# Patient Record
Sex: Male | Born: 1970 | Race: Black or African American | Hispanic: No | Marital: Married | Smoking: Current every day smoker
Health system: Southern US, Community
[De-identification: ages and names within clinical notes are randomized; demographics above are authoritative.]

---

## 2016-02-08 ENCOUNTER — Emergency Department (HOSPITAL_BASED_OUTPATIENT_CLINIC_OR_DEPARTMENT_OTHER)
Admission: EM | Admit: 2016-02-08 | Discharge: 2016-02-08 | Disposition: A | Discharge status: Home / Self Care | Payer: Self-pay | Attending: Emergency Medicine | Admitting: Emergency Medicine

## 2016-02-08 ENCOUNTER — Encounter (HOSPITAL_BASED_OUTPATIENT_CLINIC_OR_DEPARTMENT_OTHER): Payer: Self-pay | Admitting: Emergency Medicine

## 2016-02-08 DIAGNOSIS — R197 Diarrhea, unspecified: Secondary | ICD-10-CM | POA: Insufficient documentation

## 2016-02-08 DIAGNOSIS — R112 Nausea with vomiting, unspecified: Secondary | ICD-10-CM | POA: Insufficient documentation

## 2016-02-08 DIAGNOSIS — F17200 Nicotine dependence, unspecified, uncomplicated: Secondary | ICD-10-CM | POA: Insufficient documentation

## 2016-02-08 MED ORDER — PROMETHAZINE HCL 25 MG PO TABS
12.5000 mg | ORAL_TABLET | Freq: Once | ORAL | Status: AC
  Administered 2016-02-08: 12.5 mg via ORAL
  Filled 2016-02-08: qty 1

## 2016-02-08 MED ORDER — PROMETHAZINE HCL 25 MG PO TABS: 25.0000 mg | ORAL_TABLET | Freq: Four times a day (QID) | ORAL | Status: DC | PRN

## 2016-02-08 NOTE — ED Notes (Signed)
Onset yesterday approx 30 min after eating chicken with abd cramping, nausea and several episodes of diarrhea. Pt feels dehydrated.

## 2016-02-08 NOTE — ED Provider Notes (Signed)
CSN: [id number]     Arrival date & time 02/08/16  1222 History   First MD Initiated Contact with Patient 02/08/16 1305     Chief Complaint  Patient presents with  . Abdominal Pain     (Consider location/radiation/quality/duration/timing/severity/associated sxs/prior Treatment) HPI   45 year old male presenting with complaint of abdominal pain.Patient report last night while at work he ate a hamburger and some chicken strips and shortly afterward begin to develop diffuse abdominal cramping. He felt nauseous and vomited once of nonbloody nonbilious vomit and since then has multiple bouts of nonbloody non-mucousy diarrhea. His symptoms has since improved. He report drinking ginger ale did help with his nausea but he still feels nauseous and still just dehydrated. He otherwise denies having any fever, URI symptoms, chest pain, shortness of breath, productive cough, active abdominal pain or back pain, dysuria, or rash. No other recent sick contact. Patient felt this is likely to be a 24 hours bugs. No prior history of abdominal surgery and no other significant medical history. Patient is a social drinker, had a history of marijuana use but has recently quit using marijuana. He is a smoker.  History reviewed. No pertinent past medical history. History reviewed. No pertinent past surgical history. History reviewed. No pertinent family history. Social History  Substance Use Topics  . Smoking status: Current Every Day Smoker  . Smokeless tobacco: None  . Alcohol Use: Yes     Comment: socially    Review of Systems  All other systems reviewed and are negative.     Allergies  Review of patient's allergies indicates no known allergies.  Home Medications   Prior to Admission medications   Not on File   BP 120/84 mmHg  Pulse 83  Temp(Src) 98.4 F (36.9 C) (Oral)  Resp 20  SpO2 99% Physical Exam  Constitutional: He appears well-developed and well-nourished. No distress.  HENT:  Head:  Atraumatic.  Right Ear: External ear normal.  Left Ear: External ear normal.  Mouth/Throat: Oropharynx is clear and moist.  Eyes: Conjunctivae are normal.  Neck: Neck supple.  Cardiovascular: Normal rate and regular rhythm.   Pulmonary/Chest: Effort normal and breath sounds normal.  Abdominal: Soft. Bowel sounds are normal. He exhibits no distension. There is no tenderness.  Genitourinary:  No CVA tenderness  Neurological: He is alert.  Skin: No rash noted.  Psychiatric: He has a normal mood and affect.  Nursing note and vitals reviewed.   ED Course  Procedures (including critical care time)   MDM   Final diagnoses:  Nausea vomiting and diarrhea    BP 120/84 mmHg  Pulse 83  Temp(Src) 98.4 F (36.9 C) (Oral)  Resp 20  SpO2 99%   2:02 PM Patient presents with nausea vomiting diarrhea. Minimal abdominal pain. No longer having diarrhea. Mild nausea only. Able to tolerates by mouth. Suspect viral gastroenteritis. Low suspicion for acute abdominal pathology such as appendicitis or colitis. Patient agrees to treat symptoms. Return precaution discussed.  Fayrene HelperBowie Edmundo Tedesco, PA-C 02/08/16 1403  Lavera Guiseana Duo Liu, MD 02/08/16 [phone]

## 2016-02-08 NOTE — Discharge Instructions (Signed)

## 2018-05-13 ENCOUNTER — Emergency Department (HOSPITAL_BASED_OUTPATIENT_CLINIC_OR_DEPARTMENT_OTHER): Payer: Self-pay

## 2018-05-13 ENCOUNTER — Emergency Department (HOSPITAL_BASED_OUTPATIENT_CLINIC_OR_DEPARTMENT_OTHER)
Admission: EM | Admit: 2018-05-13 | Discharge: 2018-05-13 | Disposition: A | Discharge status: Home / Self Care | Payer: Self-pay | Attending: Emergency Medicine | Admitting: Emergency Medicine

## 2018-05-13 ENCOUNTER — Encounter (HOSPITAL_BASED_OUTPATIENT_CLINIC_OR_DEPARTMENT_OTHER): Payer: Self-pay

## 2018-05-13 ENCOUNTER — Other Ambulatory Visit: Payer: Self-pay

## 2018-05-13 DIAGNOSIS — F17210 Nicotine dependence, cigarettes, uncomplicated: Secondary | ICD-10-CM | POA: Insufficient documentation

## 2018-05-13 DIAGNOSIS — Y929 Unspecified place or not applicable: Secondary | ICD-10-CM | POA: Insufficient documentation

## 2018-05-13 DIAGNOSIS — S39012A Strain of muscle, fascia and tendon of lower back, initial encounter: Secondary | ICD-10-CM | POA: Insufficient documentation

## 2018-05-13 DIAGNOSIS — Y998 Other external cause status: Secondary | ICD-10-CM | POA: Insufficient documentation

## 2018-05-13 DIAGNOSIS — Y9389 Activity, other specified: Secondary | ICD-10-CM | POA: Insufficient documentation

## 2018-05-13 DIAGNOSIS — W208XXA Other cause of strike by thrown, projected or falling object, initial encounter: Secondary | ICD-10-CM | POA: Insufficient documentation

## 2018-05-13 MED ORDER — IBUPROFEN 400 MG PO TABS
600.0000 mg | ORAL_TABLET | Freq: Once | ORAL | Status: AC
  Administered 2018-05-13: 600 mg via ORAL
  Filled 2018-05-13: qty 1

## 2018-05-13 NOTE — ED Notes (Signed)
Pt/family verbalized understanding of discharge instructions.   

## 2018-05-13 NOTE — Discharge Instructions (Signed)
Develop worsening or uncontrolled pain, weakness or numbness in your legs, incontinence of your bowels or bladder, blood when you urinate or any other new/concerning symptoms and return to the ER for evaluation.  Otherwise you may use Tylenol and/or ibuprofen for pain.  You may use ice.

## 2018-05-13 NOTE — ED Triage Notes (Signed)
Pt states tires fell on his back at work approx 130am-pain to lower back-NAD-steady gait

## 2018-05-13 NOTE — ED Provider Notes (Signed)
[facility] EMERGENCY DEPARTMENT Provider Note   CSN: [id number] Arrival date & time: 05/13/18  1146     History   Chief Complaint Chief Complaint  Patient presents with  . Back Pain    HPI Alexander Clark is a 47 y.o. male.  HPI  47 year old male presents after back injury.  He was unloading a truck and multiple tires landed on his back.  Pushed him towards the ground but he did not fall or get crushed.  Pain is over his left posterior pelvis.  No midline back pain.  He has taken Goody powders with partial relief.  No weakness or numbness in the extremities.  No chest, abdominal pain.  No dyspnea.  He has urinated since this happened but has not seen any hematuria.  History reviewed. No pertinent past medical history.  There are no active problems to display for this patient.   History reviewed. No pertinent surgical history.      Home Medications    Prior to Admission medications   Medication Sig Start Date End Date Taking? Authorizing Provider  promethazine (PHENERGAN) 25 MG tablet Take 1 tablet (25 mg total) by mouth every 6 (six) hours as needed for nausea. 02/08/16   Fayrene Helperran, Bowie, PA-C    Family History No family history on file.  Social History Social History   Tobacco Use  . Smoking status: Current Every Day Smoker    Types: Cigarettes  . Smokeless tobacco: Never Used  Substance Use Topics  . Alcohol use: Yes    Comment: occ  . Drug use: Not Currently    Types: Marijuana     Allergies   Patient has no known allergies.   Review of Systems Review of Systems  Respiratory: Negative for shortness of breath.   Cardiovascular: Negative for chest pain.  Gastrointestinal: Negative for abdominal pain.  Genitourinary: Negative for hematuria.  Musculoskeletal: Positive for back pain.  Neurological: Negative for weakness and numbness.     Physical Exam Updated Vital Signs BP 121/81 (BP Location: Left Arm)   Pulse 74   Temp 98 F (36.7 C)  (Oral)   Resp 14   Ht 5\' 5"  (1.651 m)   Wt 64.4 kg   SpO2 100%   BMI 23.63 kg/m   Physical Exam  Constitutional: He is oriented to person, place, and time. He appears well-developed and well-nourished. No distress.  HENT:  Head: Normocephalic and atraumatic.  Right Ear: External ear normal.  Left Ear: External ear normal.  Nose: Nose normal.  Eyes: Right eye exhibits no discharge. Left eye exhibits no discharge.  Neck: Neck supple.  Pulmonary/Chest: Effort normal. He exhibits no tenderness.  Abdominal: Soft. He exhibits no distension. There is no tenderness.  Musculoskeletal: He exhibits no edema.       Right hip: He exhibits normal range of motion and no tenderness.       Left hip: He exhibits normal range of motion and no tenderness.       Thoracic back: He exhibits no tenderness and no bony tenderness.       Lumbar back: He exhibits no tenderness and no bony tenderness.       Back:  Neurological: He is alert and oriented to person, place, and time.  5/5 strength in BLE. Normal gross sensation  Skin: Skin is warm and dry. He is not diaphoretic.  Nursing note and vitals reviewed.    ED Treatments / Results  Labs (all labs ordered are listed, but only  abnormal results are displayed) Labs Reviewed - No data to display  EKG None  Radiology Dg Pelvis 1-2 Views  Result Date: 05/13/2018 CLINICAL DATA:  Left hip pain after tire fell on posterior pelvis last night. EXAM: PELVIS - 1-2 VIEW COMPARISON:  None. FINDINGS: There is no evidence of pelvic fracture or diastasis. No pelvic bone lesions are seen. IMPRESSION: Negative. Electronically Signed   By: Elberta Fortis M.D.   On: 05/13/2018 13:46    Procedures Procedures (including critical care time)  Medications Ordered in ED Medications  ibuprofen (ADVIL,MOTRIN) tablet 600 mg (600 mg Oral Given 05/13/18 1342)     Initial Impression / Assessment and Plan / ED Course  I have reviewed the triage vital signs and the  nursing notes.  Pertinent labs & imaging results that were available during my care of the patient were reviewed by me and considered in my medical decision making (see chart for details).     Presentation is consistent with muscular pain after direct trauma.  No CVA tenderness, gross hematuria, ecchymosis to the flank to suggest intra-abdominal/retroperitoneal injury.  No abdominal pain or rib pain.  No midline back pain to suggest lumbar fracture or injury.  Point tenderness that is mild over the left posterior pelvis and so x-ray is obtained and negative.  Ambulate without difficulty and benign neuro exam.  Discharged home.  Final Clinical Impressions(s) / ED Diagnoses   Final diagnoses:  Strain of lumbar region, initial encounter    ED Discharge Orders    None       Pricilla Loveless, MD 05/13/18 1453

## 2018-10-04 ENCOUNTER — Encounter (HOSPITAL_BASED_OUTPATIENT_CLINIC_OR_DEPARTMENT_OTHER): Payer: Self-pay | Admitting: *Deleted

## 2018-10-04 ENCOUNTER — Emergency Department (HOSPITAL_BASED_OUTPATIENT_CLINIC_OR_DEPARTMENT_OTHER): Payer: 59

## 2018-10-04 ENCOUNTER — Emergency Department (HOSPITAL_BASED_OUTPATIENT_CLINIC_OR_DEPARTMENT_OTHER)
Admission: EM | Admit: 2018-10-04 | Discharge: 2018-10-04 | Disposition: A | Discharge status: Home / Self Care | Payer: 59 | Attending: Emergency Medicine | Admitting: Emergency Medicine

## 2018-10-04 ENCOUNTER — Other Ambulatory Visit: Payer: Self-pay

## 2018-10-04 DIAGNOSIS — F17210 Nicotine dependence, cigarettes, uncomplicated: Secondary | ICD-10-CM | POA: Insufficient documentation

## 2018-10-04 DIAGNOSIS — Y9289 Other specified places as the place of occurrence of the external cause: Secondary | ICD-10-CM | POA: Insufficient documentation

## 2018-10-04 DIAGNOSIS — S39012A Strain of muscle, fascia and tendon of lower back, initial encounter: Secondary | ICD-10-CM | POA: Insufficient documentation

## 2018-10-04 DIAGNOSIS — Y9389 Activity, other specified: Secondary | ICD-10-CM | POA: Diagnosis not present

## 2018-10-04 DIAGNOSIS — X509XXA Other and unspecified overexertion or strenuous movements or postures, initial encounter: Secondary | ICD-10-CM | POA: Insufficient documentation

## 2018-10-04 DIAGNOSIS — S3992XA Unspecified injury of lower back, initial encounter: Secondary | ICD-10-CM | POA: Diagnosis present

## 2018-10-04 DIAGNOSIS — Y998 Other external cause status: Secondary | ICD-10-CM | POA: Diagnosis not present

## 2018-10-04 MED ORDER — NAPROXEN 500 MG PO TABS: 500.0000 mg | ORAL_TABLET | Freq: Two times a day (BID) | ORAL | 0 refills | Status: DC

## 2018-10-04 MED ORDER — METHOCARBAMOL 500 MG PO TABS: 500.0000 mg | ORAL_TABLET | Freq: Every evening | ORAL | 0 refills | Status: DC | PRN

## 2018-10-04 NOTE — ED Notes (Signed)
Patient transported to X-ray 

## 2018-10-04 NOTE — Discharge Instructions (Addendum)
Take naproxen 2 times a day with meals.  Do not take other anti-inflammatories at the same time (Advil, Motrin, ibuprofen, Aleve). You may supplement with Tylenol if you need further pain control. Use Robaxin as needed for muscle stiffness or soreness. Have caution, as this may make you tired or groggy. Do not drive or operate heavy machinery while taking this medication.  Use muscle creams (bengay, icy hot, salonpas) as needed for pain.  Call [facility] to establish care.  Return to the ER if you develop high fevers, numbness, loss of bowel or bladder control, or any new or concerning symptoms.

## 2018-10-04 NOTE — ED Provider Notes (Signed)
[facility] EMERGENCY DEPARTMENT Provider Note   CSN: [id number] Arrival date & time: 10/04/18  1743     History   Chief Complaint Chief Complaint  Patient presents with  . Back Pain    HPI Alexander Clark is a 48 y.o. male presenting for evaluation of back pain.  Patient states the past 2 days, he has been having left-sided low back pain.  This began while he was helping a friend move.  He bent forward to lift something up, and when he stood up he felt something pull.  Since then, pain is been constant, worse with movement.  He has not taken anything for pain including Tylenol or ibuprofen, but has been using heating pads without improvement.  He denies radiation into his leg.  He denies other fall or injury.  He denies fevers, chills, rash, nausea, vomiting, urinary symptoms, loss of bowel bladder control, numbness, tingling.  He denies a history of back pain, although per chart review he has been seen for back strain in the past.  He states he has no other medical problems, takes no medications daily.  HPI  History reviewed. No pertinent past medical history.  There are no active problems to display for this patient.   History reviewed. No pertinent surgical history.      Home Medications    Prior to Admission medications   Medication Sig Start Date End Date Taking? Authorizing Provider  methocarbamol (ROBAXIN) 500 MG tablet Take 1 tablet (500 mg total) by mouth at bedtime as needed. 10/04/18   Stephane Junkins, PA-C  naproxen (NAPROSYN) 500 MG tablet Take 1 tablet (500 mg total) by mouth 2 (two) times daily with a meal. 10/04/18   Penn Grissett, PA-C    Family History History reviewed. No pertinent family history.  Social History Social History   Tobacco Use  . Smoking status: Current Every Day Smoker    Packs/day: 0.50    Types: Cigarettes  . Smokeless tobacco: Never Used  Substance Use Topics  . Alcohol use: Yes    Comment: occ  . Drug use: Not  Currently    Types: Marijuana     Allergies   Patient has no known allergies.   Review of Systems Review of Systems  Musculoskeletal: Positive for back pain.  All other systems reviewed and are negative.    Physical Exam Updated Vital Signs BP (!) 138/101 (BP Location: Right Arm)   Pulse 90   Temp 98.1 F (36.7 C) (Oral)   Resp 18   Ht 5\' 5"  (1.651 m)   Wt 68 kg   SpO2 100%   BMI 24.96 kg/m   Physical Exam Vitals signs and nursing note reviewed.  Constitutional:      General: He is not in acute distress.    Appearance: He is well-developed.     Comments: Sitting in the bed in no acute distress  HENT:     Head: Normocephalic and atraumatic.  Neck:     Musculoskeletal: Normal range of motion.  Pulmonary:     Effort: Pulmonary effort is normal.  Abdominal:     General: There is no distension.     Palpations: Abdomen is soft.     Tenderness: There is no abdominal tenderness.  Musculoskeletal: Normal range of motion.        General: Tenderness present.     Comments: Tenderness palpation of left low back musculature.  No pain over midline spine.  Strength of lower extremities intact bilaterally.  Sensation intact bilaterally.  No saddle paresthesias.  Patellar reflexes equal.  Patient is ambulatory.  Skin:    General: Skin is warm.     Capillary Refill: Capillary refill takes less than 2 seconds.     Findings: No rash.  Neurological:     Mental Status: He is alert and oriented to person, place, and time.      ED Treatments / Results  Labs (all labs ordered are listed, but only abnormal results are displayed) Labs Reviewed - No data to display  EKG None  Radiology Dg Lumbar Spine Complete  Result Date: 10/04/2018 CLINICAL DATA:  Worsening low back pain for the past week following sudden onset. No known injury. EXAM: LUMBAR SPINE - COMPLETE 4+ VIEW COMPARISON:  None. FINDINGS: Five non-rib-bearing lumbar vertebrae. Mild anterior spur formation at the L2-3,  L3-4 and L4-5 levels. No fractures, pars defects or subluxations. Mild atheromatous arterial calcifications. IMPRESSION: 1. Mild lumbar spine degenerative changes with no acute abnormality seen. 2. Mild atheromatous arterial calcifications. Electronically Signed   By: Beckie Salts M.D.   On: 10/04/2018 18:55    Procedures Procedures (including critical care time)  Medications Ordered in ED Medications - No data to display   Initial Impression / Assessment and Plan / ED Course  I have reviewed the triage vital signs and the nursing notes.  Pertinent labs & imaging results that were available during my care of the patient were reviewed by me and considered in my medical decision making (see chart for details).     Patient presenting for evaluation of low back pain.  Physical exam reassuring, neurovascularly intact.  No red flags for back pain.  Pain is reproducible with palpation of the musculature.  Likely musculoskeletal. Xray viewed and interpreted by me, no fx or dislocation.  Doubt vertebral injury, infection, spinal cord compression, myelopathy, or cauda equina syndrome.  Will treat symptomatically with NSAIDs, muscle relaxers, muscle creams.  Patient given information to follow-up with primary care.  At this time, patient appears safe for discharge.  Return precautions given.  Patient states he understands agrees plan.   Final Clinical Impressions(s) / ED Diagnoses   Final diagnoses:  Strain of lumbar region, initial encounter    ED Discharge Orders         Ordered    naproxen (NAPROSYN) 500 MG tablet  2 times daily with meals     10/04/18 2029    methocarbamol (ROBAXIN) 500 MG tablet  At bedtime PRN     10/04/18 2029           Alveria Apley, PA-C 10/04/18 2317    Tegeler, Canary Brim, MD 10/05/18 0020

## 2018-10-04 NOTE — ED Triage Notes (Signed)
Pt c/o lower left back pain x 3 days

## 2019-04-20 ENCOUNTER — Emergency Department (HOSPITAL_COMMUNITY)
Admission: EM | Admit: 2019-04-20 | Discharge: 2019-04-20 | Disposition: A | Discharge status: Home / Self Care | Payer: 59 | Attending: Emergency Medicine | Admitting: Emergency Medicine

## 2019-04-20 DIAGNOSIS — H6122 Impacted cerumen, left ear: Secondary | ICD-10-CM | POA: Diagnosis not present

## 2019-04-20 DIAGNOSIS — H60502 Unspecified acute noninfective otitis externa, left ear: Secondary | ICD-10-CM | POA: Insufficient documentation

## 2019-04-20 DIAGNOSIS — Z79899 Other long term (current) drug therapy: Secondary | ICD-10-CM | POA: Insufficient documentation

## 2019-04-20 DIAGNOSIS — F17210 Nicotine dependence, cigarettes, uncomplicated: Secondary | ICD-10-CM | POA: Diagnosis not present

## 2019-04-20 DIAGNOSIS — H9202 Otalgia, left ear: Secondary | ICD-10-CM | POA: Diagnosis present

## 2019-04-20 MED ORDER — CIPROFLOXACIN HCL 500 MG PO TABS: 500.0000 mg | ORAL_TABLET | Freq: Two times a day (BID) | ORAL | 0 refills | Status: AC

## 2019-04-20 NOTE — ED Notes (Signed)
Pt stated that he was going outside to smoke, was called once.

## 2019-04-20 NOTE — ED Provider Notes (Signed)
[facility] EMERGENCY DEPARTMENT Provider Note   CSN: [id number] Arrival date & time: 04/20/19  0219     History   Chief Complaint Chief Complaint  Patient presents with  . Hearing Issue    Left ear    HPI Alexander Clark is a 48 y.o. male presents for evaluation of left ear pain and decreased hearing x2 days.  He states that his pain is getting worse, vomiting ED visit.  He has not noted any fevers or drainage from the ear.  He says he does occasionally clean out his ears with Q-tips.  He does not recall having anything stuck in the ear.     The history is provided by the patient.    No past medical history on file.  There are no active problems to display for this patient.   No past surgical history on file.      Home Medications    Prior to Admission medications   Medication Sig Start Date End Date Taking? Authorizing Provider  ciprofloxacin (CIPRO) 500 MG tablet Take 1 tablet (500 mg total) by mouth every 12 (twelve) hours for 7 days. 04/20/19 04/27/19  Volanda Napoleon, PA-C  methocarbamol (ROBAXIN) 500 MG tablet Take 1 tablet (500 mg total) by mouth at bedtime as needed. 10/04/18   Caccavale, Sophia, PA-C  naproxen (NAPROSYN) 500 MG tablet Take 1 tablet (500 mg total) by mouth 2 (two) times daily with a meal. 10/04/18   Caccavale, Sophia, PA-C    Family History No family history on file.  Social History Social History   Tobacco Use  . Smoking status: Current Every Day Smoker    Packs/day: 0.50    Types: Cigarettes  . Smokeless tobacco: Never Used  Substance Use Topics  . Alcohol use: Yes    Comment: occ  . Drug use: Not Currently    Types: Marijuana     Allergies   Patient has no known allergies.   Review of Systems Review of Systems  Constitutional: Negative for fever.  HENT: Positive for ear pain and hearing loss. Negative for ear discharge.   All other systems reviewed and are negative.    Physical Exam Updated Vital Signs  BP (!) 131/94 (BP Location: Right Arm)   Pulse 78   Temp 98.4 F (36.9 C) (Oral)   Resp 16   SpO2 100%   Physical Exam Vitals signs and nursing note reviewed.  Constitutional:      Appearance: He is well-developed.  HENT:     Head: Normocephalic and atraumatic.     Right Ear: Tympanic membrane normal.     Ears:     Comments: Small amount of wax noted in right external auditory ear canal but still able to visualize TM.  TM without any bulging, erythema, effusion.  Unable to see left TM secondary to extensive cerumen impaction.  No warmth, erythema, tenderness noted to mastoid process bilaterally. Eyes:     General: No scleral icterus.       Right eye: No discharge.        Left eye: No discharge.     Conjunctiva/sclera: Conjunctivae normal.  Pulmonary:     Effort: Pulmonary effort is normal.  Skin:    General: Skin is warm and dry.  Neurological:     Mental Status: He is alert.  Psychiatric:        Speech: Speech normal.        Behavior: Behavior normal.      ED  Treatments / Results  Labs (all labs ordered are listed, but only abnormal results are displayed) Labs Reviewed - No data to display  EKG None  Radiology No results found.  Procedures .Ear Cerumen Removal  Date/Time: 04/20/2019 8:55 AM Performed by: Maxwell CaulLayden, Shirlee Whitmire A, PA-C Authorized by: Maxwell CaulLayden, Aviela Blundell A, PA-C   Consent:    Consent obtained:  Verbal   Consent given by:  Patient   Risks discussed:  Bleeding, infection, incomplete removal and TM perforation   Alternatives discussed:  No treatment Procedure details:    Location:  L ear   Procedure type: curette   Post-procedure details:    Inspection:  Bleeding   Hearing quality:  Improved   Patient tolerance of procedure:  Tolerated well, no immediate complications   (including critical care time)  Medications Ordered in ED Medications - No data to display   Initial Impression / Assessment and Plan / ED Course  I have reviewed the triage  vital signs and the nursing notes.  Pertinent labs & imaging results that were available during my care of the patient were reviewed by me and considered in my medical decision making (see chart for details).        48 year old male who presents for evaluation of left ear pain/decreased hearing x2 days.  No fevers, drainage from ear.  On initial ED arrival, he is afebrile, appears in no acute distress and is sitting comfortably on chair.  Vitals stable.  On exam, unable to visualize left TM secondary to cerumen impaction.  Suspect this is source of his symptoms.  Doubt acute otitis media, otitis externa.  History/physical exam not concerning for mastoiditis.  I attempted removal of cerumen with curette.  I was able to get a small amount of out but still had significant amount and there.  Will attempt irrigation.  Hydration after irrigation.  Significant minimal wax removed.  Stenotic canal is slightly erythematous and irritated after removal with curette and irrigation.  Still questionable visualization of the TM.  Does not look perforated but cannot see full landmarks.  Given irritation of the external auditory canal, and to treat with acute otitis media.  Given that I cannot visualize TM, will plan to do oral antibiotic.  Patient with no known drug allergies.  I discussed with patient regarding your care. At this time, patient exhibits no emergent life-threatening condition that require further evaluation in ED or admission. Patient had ample opportunity for questions and discussion. All patient's questions were answered with full understanding. Strict return precautions discussed. Patient expresses understanding and agreement to plan.   Portions of this note were generated with Scientist, clinical (histocompatibility and immunogenetics)Dragon dictation software. Dictation errors may occur despite best attempts at proofreading.   Final Clinical Impressions(s) / ED Diagnoses   Final diagnoses:  Impacted cerumen of left ear  Acute otitis externa of left  ear, unspecified type    ED Discharge Orders         Ordered    ciprofloxacin (CIPRO) 500 MG tablet  Every 12 hours     04/20/19 0921           Maxwell CaulLayden, Armstead Heiland A, PA-C 04/20/19 1339    Blane OharaZavitz, Joshua, MD 04/20/19 1626

## 2019-04-20 NOTE — Discharge Instructions (Signed)
Take antibiotics as directed. Please take all of your antibiotics until finished.  Return the emergency department for any worsening pain, fever, drainage from the ear or any other worsening concerning symptoms.

## 2019-05-02 ENCOUNTER — Other Ambulatory Visit: Payer: Self-pay

## 2019-05-02 ENCOUNTER — Emergency Department (HOSPITAL_BASED_OUTPATIENT_CLINIC_OR_DEPARTMENT_OTHER)
Admission: EM | Admit: 2019-05-02 | Discharge: 2019-05-02 | Disposition: A | Discharge status: Home / Self Care | Payer: 59 | Attending: Emergency Medicine | Admitting: Emergency Medicine

## 2019-05-02 ENCOUNTER — Encounter (HOSPITAL_BASED_OUTPATIENT_CLINIC_OR_DEPARTMENT_OTHER): Payer: Self-pay

## 2019-05-02 DIAGNOSIS — H9202 Otalgia, left ear: Secondary | ICD-10-CM | POA: Diagnosis present

## 2019-05-02 DIAGNOSIS — H9192 Unspecified hearing loss, left ear: Secondary | ICD-10-CM | POA: Diagnosis not present

## 2019-05-02 DIAGNOSIS — H6122 Impacted cerumen, left ear: Secondary | ICD-10-CM | POA: Diagnosis not present

## 2019-05-02 DIAGNOSIS — F17210 Nicotine dependence, cigarettes, uncomplicated: Secondary | ICD-10-CM | POA: Diagnosis not present

## 2019-05-02 DIAGNOSIS — F1210 Cannabis abuse, uncomplicated: Secondary | ICD-10-CM | POA: Diagnosis not present

## 2019-05-02 MED ORDER — DOCUSATE SODIUM 50 MG/5ML PO LIQD
ORAL | Status: AC
  Filled 2019-05-02: qty 10

## 2019-05-02 NOTE — ED Provider Notes (Signed)
[facility] EMERGENCY DEPARTMENT Provider Note   CSN: [id number] Arrival date & time: 05/02/19  2144     History   Chief Complaint Chief Complaint  Patient presents with  . Hearing Problem    HPI Alexander Clark is a 48 y.o. male.     48 yo M with a chief complaints of left ear pain and decreased hearing.  Going on for the past week or so.  Went to [facility] emergency department and was diagnosed with wax impaction.  Had the area partially removed.  He felt like they were in a hurry.  Continue to have issues with pain and decreased hearing to that ear. No fevers.   The history is provided by the patient.  Otalgia Location:  Left Behind ear:  No abnormality Quality:  Aching Severity:  Mild Onset quality:  Gradual Duration:  2 days Timing:  Constant Progression:  Worsening Chronicity:  Recurrent Relieved by:  Nothing Worsened by:  Nothing Ineffective treatments:  None tried Associated symptoms: no abdominal pain, no congestion, no diarrhea, no fever, no headaches, no rash and no vomiting     History reviewed. No pertinent past medical history.  There are no active problems to display for this patient.   History reviewed. No pertinent surgical history.      Home Medications    Prior to Admission medications   Medication Sig Start Date End Date Taking? Authorizing Provider  methocarbamol (ROBAXIN) 500 MG tablet Take 1 tablet (500 mg total) by mouth at bedtime as needed. 10/04/18   Caccavale, Sophia, PA-C  naproxen (NAPROSYN) 500 MG tablet Take 1 tablet (500 mg total) by mouth 2 (two) times daily with a meal. 10/04/18   Caccavale, Sophia, PA-C    Family History No family history on file.  Social History Social History   Tobacco Use  . Smoking status: Current Every Day Smoker    Packs/day: 0.50    Types: Cigarettes  . Smokeless tobacco: Never Used  Substance Use Topics  . Alcohol use: Yes    Comment: occ  . Drug use: Yes    Types: Marijuana     Allergies   Patient has no known allergies.   Review of Systems Review of Systems  Constitutional: Negative for chills and fever.  HENT: Positive for ear pain. Negative for congestion and facial swelling.   Eyes: Negative for discharge and visual disturbance.  Respiratory: Negative for shortness of breath.   Cardiovascular: Negative for chest pain and palpitations.  Gastrointestinal: Negative for abdominal pain, diarrhea and vomiting.  Musculoskeletal: Negative for arthralgias and myalgias.  Skin: Negative for color change and rash.  Neurological: Negative for tremors, syncope and headaches.  Psychiatric/Behavioral: Negative for confusion and dysphoric mood.     Physical Exam Updated Vital Signs BP (!) 131/100 (BP Location: Left Arm)   Pulse 92   Temp 98.2 F (36.8 C) (Oral)   Resp 18   Ht 5\' 4"  (1.626 m)   Wt 64.9 kg   SpO2 98%   BMI 24.55 kg/m   Physical Exam Vitals signs and nursing note reviewed.  Constitutional:      Appearance: He is well-developed.  HENT:     Head: Normocephalic and atraumatic.     Right Ear: Tympanic membrane normal.     Left Ear: Tympanic membrane normal.  Eyes:     Pupils: Pupils are equal, round, and reactive to light.  Neck:     Musculoskeletal: Normal range of motion and neck supple.  Vascular: No JVD.  Cardiovascular:     Rate and Rhythm: Normal rate and regular rhythm.     Heart sounds: No murmur. No friction rub. No gallop.   Pulmonary:     Effort: No respiratory distress.     Breath sounds: No wheezing.  Abdominal:     General: There is no distension.     Tenderness: There is no guarding or rebound.  Musculoskeletal: Normal range of motion.  Skin:    Coloration: Skin is not pale.     Findings: No rash.  Neurological:     Mental Status: He is alert and oriented to person, place, and time.  Psychiatric:        Behavior: Behavior normal.      ED Treatments / Results  Labs (all labs ordered are listed, but only  abnormal results are displayed) Labs Reviewed - No data to display  EKG None  Radiology No results found.  Procedures Procedures (including critical care time)  Medications Ordered in ED Medications  docusate (COLACE) 50 MG/5ML liquid (has no administration in time range)     Initial Impression / Assessment and Plan / ED Course  I have reviewed the triage vital signs and the nursing notes.  Pertinent labs & imaging results that were available during my care of the patient were reviewed by me and considered in my medical decision making (see chart for details).        48 yo M with a chief complaints of left-sided ear pain and decreased hearing.  Upon my exam the patient had had a large amount of earwax removed by the nurse at bedside.  He is feeling much better.  We will discharge the patient home.  10:48 PM:  I have discussed the diagnosis/risks/treatment options with the patient and believe the pt to be eligible for discharge home to follow-up with PCP. We also discussed returning to the ED immediately if new or worsening sx occur. We discussed the sx which are most concerning (e.g., sudden worsening pain, fever, inability to tolerate by mouth) that necessitate immediate return. Medications administered to the patient during their visit and any new prescriptions provided to the patient are listed below.  Medications given during this visit Medications  docusate (COLACE) 50 MG/5ML liquid (has no administration in time range)     The patient appears reasonably screen and/or stabilized for discharge and I doubt any other medical condition or other [facility] requiring further screening, evaluation, or treatment in the ED at this time prior to discharge.    Final Clinical Impressions(s) / ED Diagnoses   Final diagnoses:  Impacted cerumen of left ear    ED Discharge Orders    None       Melene PlanFloyd, Winnona Wargo, DO 05/02/19 2248

## 2019-05-02 NOTE — ED Triage Notes (Signed)
Pt c/o "muffled" hearing to left ear-seen for same at [facility] 8/20-NAD-steady gait

## 2019-05-02 NOTE — ED Notes (Signed)
Large amt of ear wax removed form right ear

## 2019-05-02 NOTE — ED Notes (Signed)
ED Provider at bedside. 

## 2020-03-08 IMAGING — CR DG PELVIS 1-2V
1 series · 1 of 1 positions shown · non-contrast
Comparison: None.

CLINICAL DATA: Left hip pain after tire fell on posterior pelvis
last night.

EXAM:
PELVIS - 1-2 VIEW

[t pelvis a.p.]
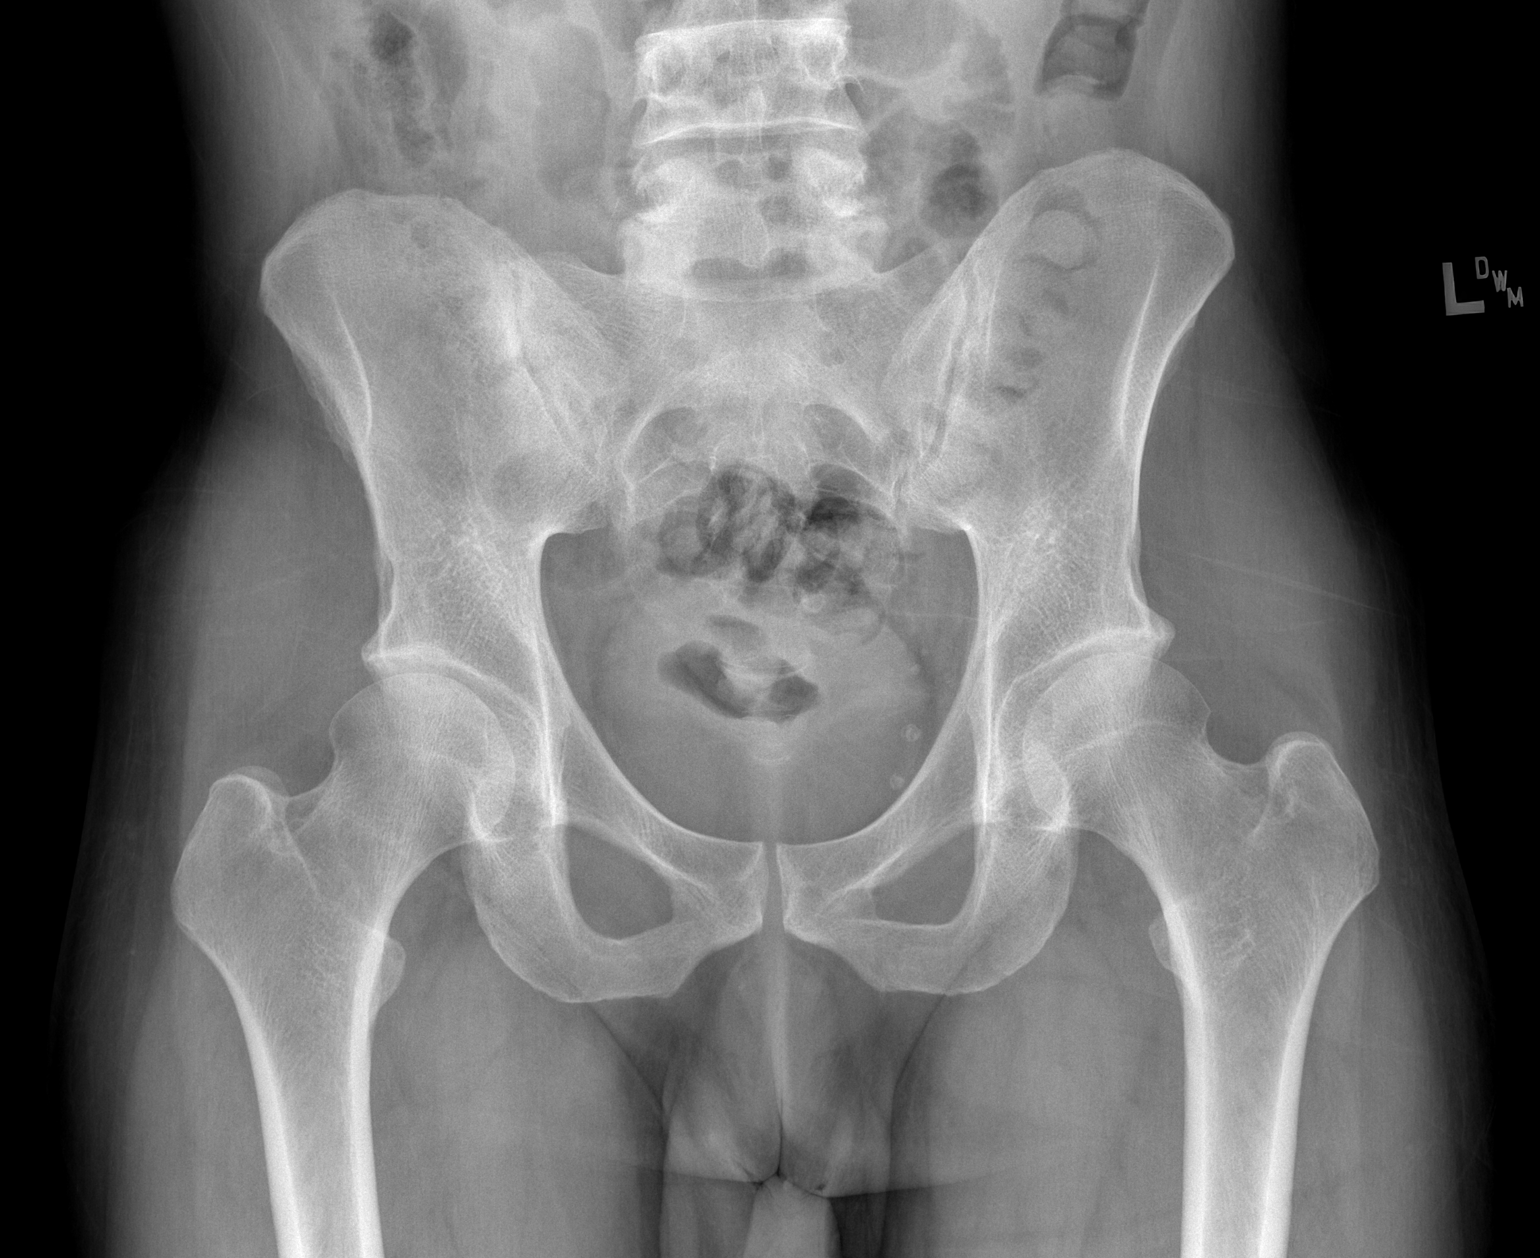

[1 of 1 positions shown; findings below may reference images not displayed]

FINDINGS: There is no evidence of pelvic fracture or diastasis. No pelvic bone
lesions are seen.
IMPRESSION: Negative.

## 2020-07-30 IMAGING — CR DG LUMBAR SPINE COMPLETE 4+V
5 series · 5 of 5 positions shown · non-contrast
Comparison: None.

CLINICAL DATA: Worsening low back pain for the past week following
sudden onset. No known injury.

EXAM:
LUMBAR SPINE - COMPLETE 4+ VIEW

[t l-spine a.p.]
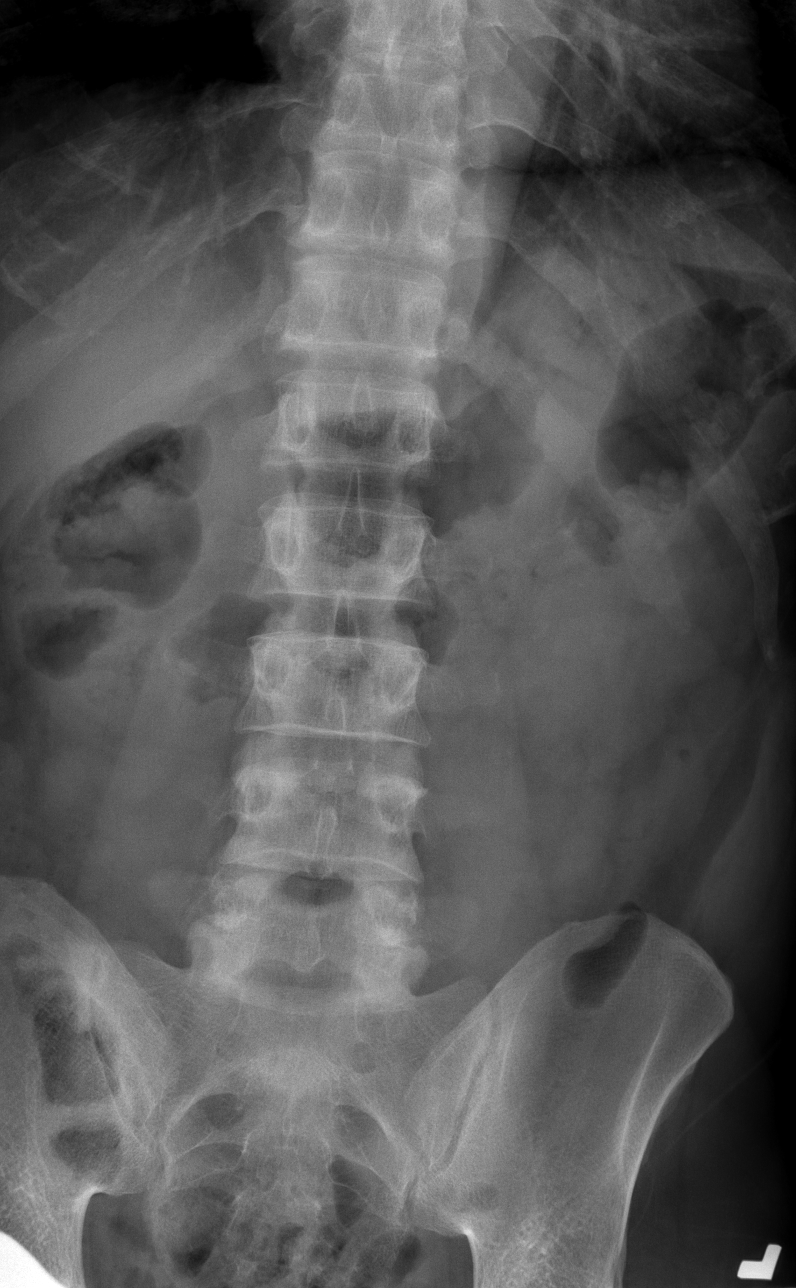

[t l-spine oblique exposure (1 of 2)]
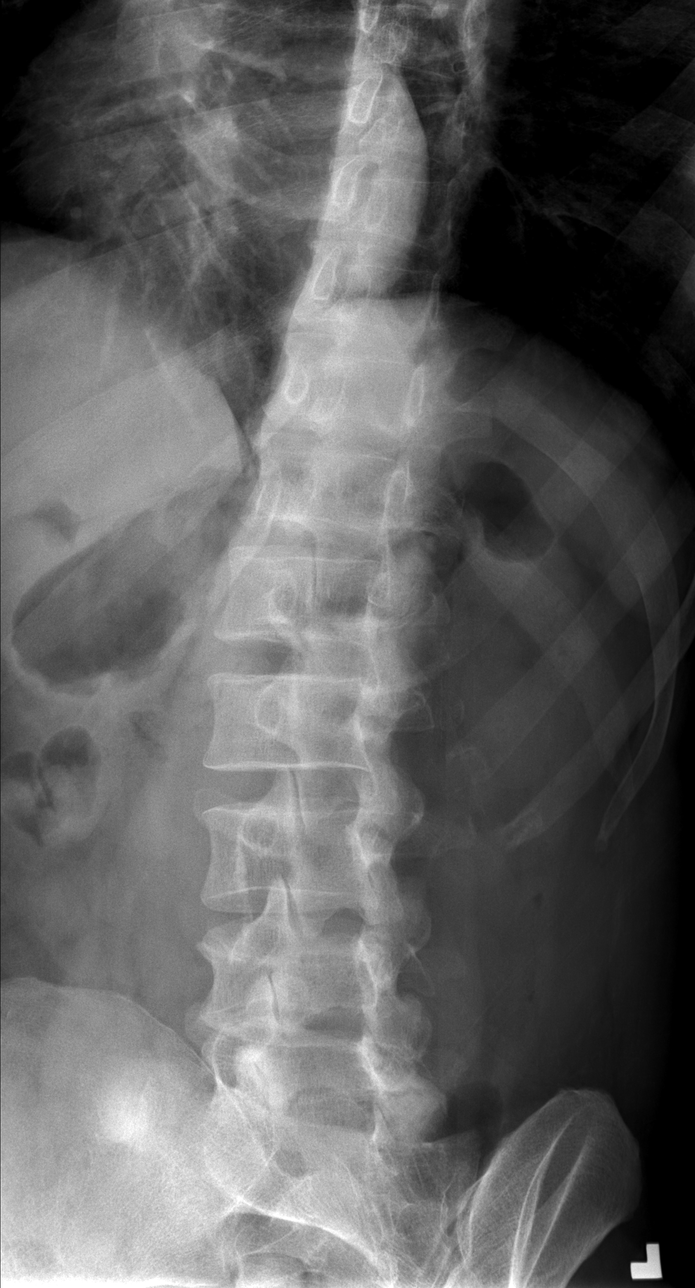

[t l-spine oblique exposure (2 of 2)]
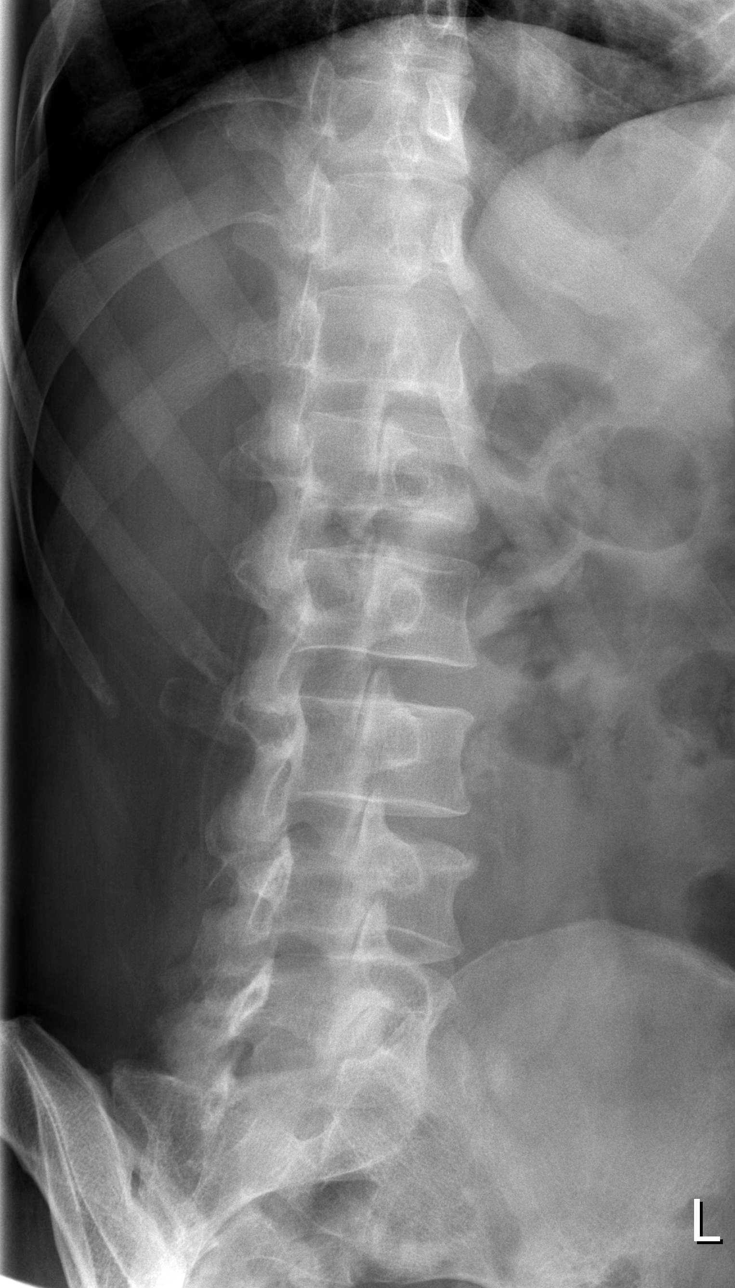

[t l-spine lat]
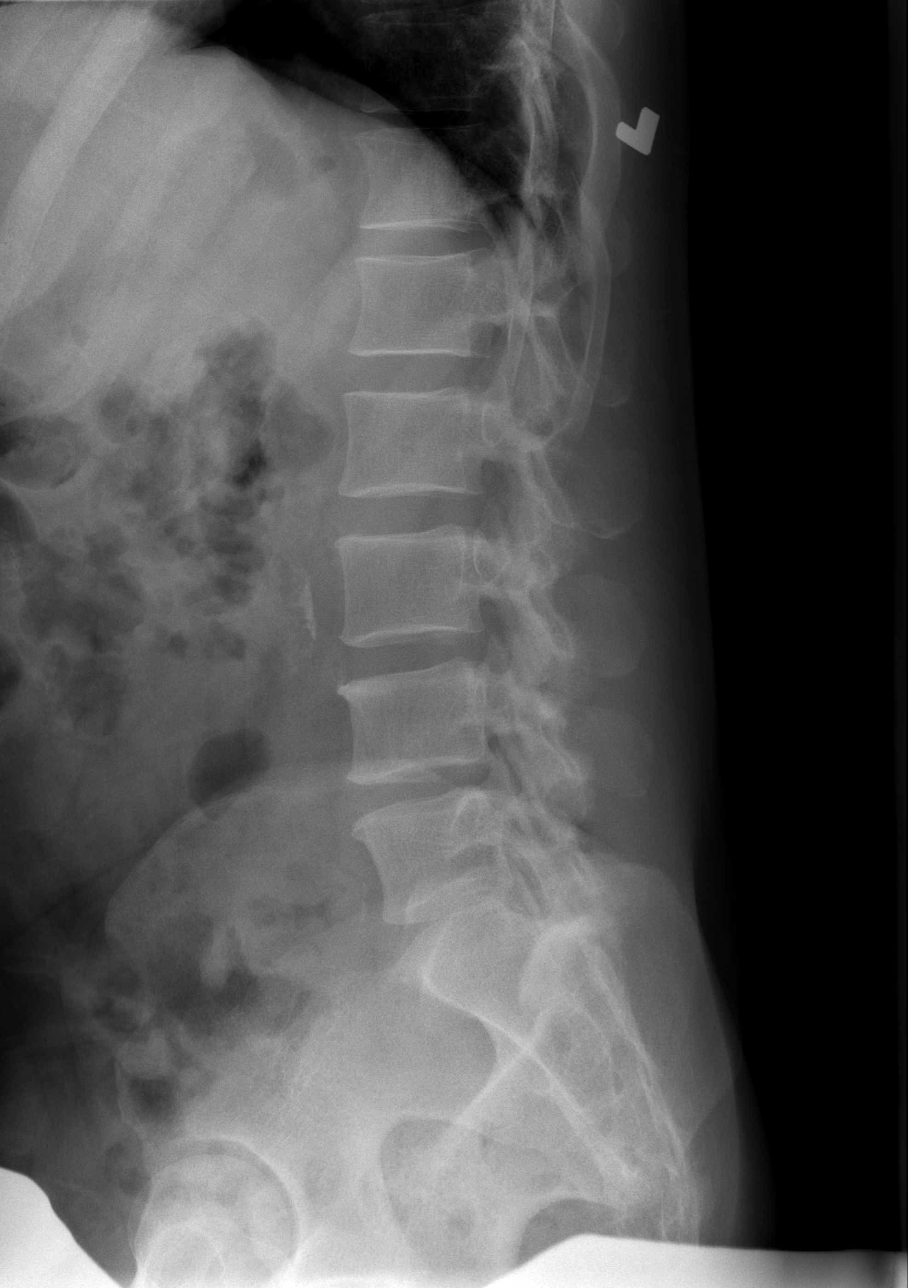

[t l-spine l5-s1 spot]
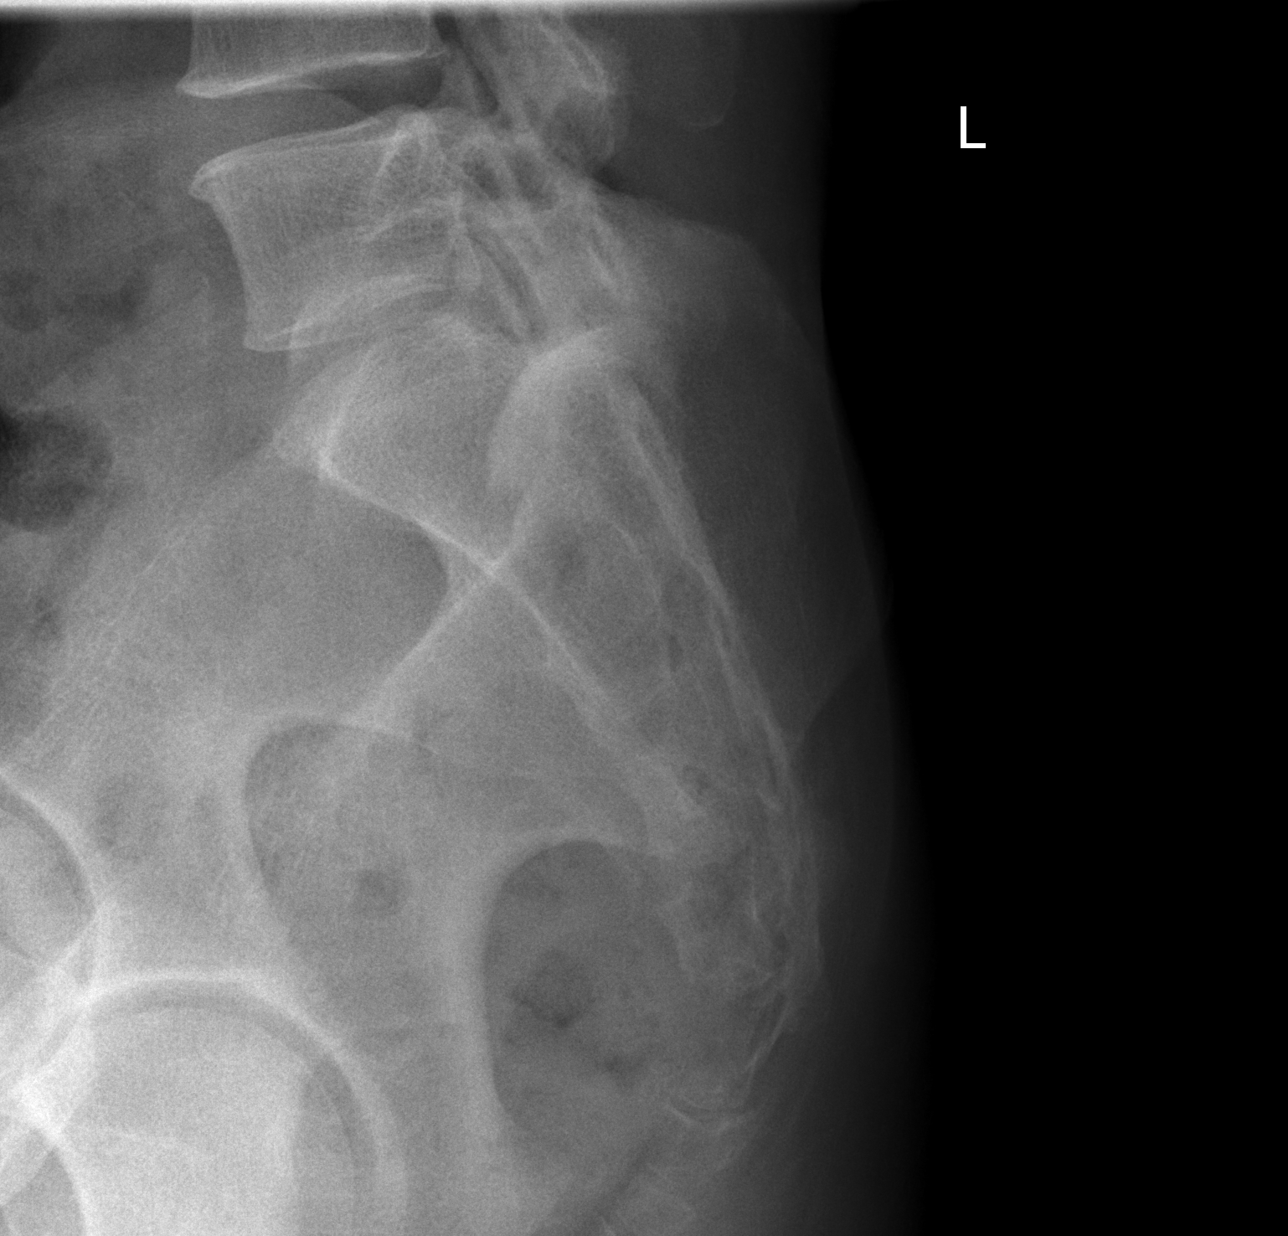

[5 of 5 positions shown; findings below may reference images not displayed]

FINDINGS: Five non-rib-bearing lumbar vertebrae. Mild anterior spur formation
at the L2-3, L3-4 and L4-5 levels. No fractures, pars defects or
subluxations. Mild atheromatous arterial calcifications.
IMPRESSION: 1. Mild lumbar spine degenerative changes with no acute abnormality
seen.
2. Mild atheromatous arterial calcifications.

## 2022-12-03 ENCOUNTER — Ambulatory Visit (INDEPENDENT_AMBULATORY_CARE_PROVIDER_SITE_OTHER): Payer: Self-pay

## 2022-12-03 ENCOUNTER — Ambulatory Visit (HOSPITAL_COMMUNITY)
Admission: EM | Admit: 2022-12-03 | Discharge: 2022-12-03 | Disposition: A | Discharge status: Home / Self Care | Payer: Self-pay | Attending: Sports Medicine | Admitting: Sports Medicine

## 2022-12-03 ENCOUNTER — Encounter (HOSPITAL_COMMUNITY): Payer: Self-pay | Admitting: Emergency Medicine

## 2022-12-03 DIAGNOSIS — M79674 Pain in right toe(s): Secondary | ICD-10-CM

## 2022-12-03 DIAGNOSIS — M545 Low back pain, unspecified: Secondary | ICD-10-CM

## 2022-12-03 DIAGNOSIS — R030 Elevated blood-pressure reading, without diagnosis of hypertension: Secondary | ICD-10-CM

## 2022-12-03 DIAGNOSIS — Z720 Tobacco use: Secondary | ICD-10-CM

## 2022-12-03 MED ORDER — METHOCARBAMOL 500 MG PO TABS: 500.0000 mg | ORAL_TABLET | Freq: Every evening | ORAL | 0 refills | Status: AC | PRN

## 2022-12-03 MED ORDER — NAPROXEN 500 MG PO TABS: 500.0000 mg | ORAL_TABLET | Freq: Two times a day (BID) | ORAL | 0 refills | Status: AC

## 2022-12-03 NOTE — Discharge Instructions (Addendum)
The x-rays of your toe did not show any fracture.  I have sent to your pharmacy naproxen 1 tablet twice a day for your pain and swelling.  This medicine will also be helpful for your back pain.  I have also sent some Robaxin, a muscle relaxer to your pharmacy that you can try in the evenings for muscle spasms.  Once your pain feels a little better in a couple days, I recommend you work on the stretches and exercises that I have provided for you.  Follow-up with your primary care provider if symptoms worsen or fail to improve. Your blood pressure today was elevated here.  The naproxen medication may increase your blood pressure a little bit so I would use it sparingly only as needed until your pain resolves.  Keep track of your blood pressure over the next couple weeks.  If it continues to be elevated greater than 140/90 recommend he follow-up with the urgent care or a primary care provider.  May also look at the [facility] health mobile health unit.  Smoking cessation, reduced salt diet and regular exercise can also be helpful.

## 2022-12-03 NOTE — ED Provider Notes (Signed)
[facility]    CSN: [id number] Arrival date & time: 12/03/22  [id number]      History   Chief Complaint Chief Complaint  Patient presents with   Back Pain   Toe Pain    HPI Alexander Clark is a 52 y.o. male.   He is here today with chief complaint of low back and toe pain.  He reports with his back pain his toe pain started yesterday.  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  He is able to complete his shift however it caused him to limp a little bit on that side.  He denies any bruising or bleeding.  He did notice some swelling last night of that toe.  His back pain began last night when he was bending twisting and lifting a lot.  He reports pain is on both sides of his low back.  He denies any radiation, numbness or tingling or loss of bowel or bladder control in his low back.   Back Pain Toe Pain    History reviewed. No pertinent past medical history.  There are no problems to display for this patient.   History reviewed. No pertinent surgical history.     Home Medications    Prior to Admission medications   Medication Sig Start Date End Date Taking? Authorizing Provider  methocarbamol (ROBAXIN) 500 MG tablet Take 1 tablet (500 mg total) by mouth at bedtime as needed. 12/03/22   Rodena Goldmann A, DO  naproxen (NAPROSYN) 500 MG tablet Take 1 tablet (500 mg total) by mouth 2 (two) times daily with a meal. 12/03/22   Elmore Guise, DO    Family History History reviewed. No pertinent family history.  Social History Social History   Tobacco Use   Smoking status: Every Day    Packs/day: .5    Types: Cigarettes   Smokeless tobacco: Never  Vaping Use   Vaping Use: Never used  Substance Use Topics   Alcohol use: Yes    Comment: occ   Drug use: Yes    Types: Marijuana     Allergies   Patient has no known allergies.   Review of Systems Review of Systems  Musculoskeletal:  Positive for back pain.  As listed above in  HPI   Physical Exam Triage Vital Signs ED Triage Vitals  Enc Vitals Group     BP 12/03/22 1003 (!) 160/100     Pulse Rate 12/03/22 1003 86     Resp 12/03/22 1003 18     Temp 12/03/22 1003 97.6 F (36.4 C)     Temp Source 12/03/22 1003 Oral     SpO2 12/03/22 1003 96 %     Weight --      Height --      Head Circumference --      Peak Flow --      Pain Score 12/03/22 1001 7     Pain Loc --      Pain Edu? --      Excl. in [place]? --    No data found.  Updated Vital Signs BP (!) 160/100 (BP Location: Left Arm)   Pulse 86   Temp 97.6 F (36.4 C) (Oral)   Resp 18   SpO2 96%   Physical Exam Vitals reviewed.  Constitutional:      General: He is not in acute distress.    Appearance: Normal appearance. He is normal weight. He is not  ill-appearing, toxic-appearing or diaphoretic.  Cardiovascular:     Rate and Rhythm: Normal rate.  Pulmonary:     Effort: Pulmonary effort is normal.  Neurological:     Mental Status: He is alert.   Right foot: No obvious deformity or asymmetry.  He does have some chronic hyperpigmentation over the first MTP some slight swelling.  Some decreased range of motion with great toe extension secondary to pain.  Passively he does have some slightly decreased great toe extension.  Strength 5/5 resisted great toe flexion.  Dorsalis pedis 2+.  No ecchymosis.  No tenderness to palpation of the first tarsal bone, or navicular. Lumbar spine: No obvious deformity or asymmetry.  Tenderness to palpation of the bilateral paraspinals of the lumbar spine and quadratus lumborum.  Good range of motion with flexion extension.  Negative seated straight leg raise bilaterally.  Strength 5/5 resisted hip flexion.  Slightly antalgic gait.   UC Treatments / Results  Labs (all labs ordered are listed, but only abnormal results are displayed) Labs Reviewed - No data to display  EKG   Radiology DG Toe Great Right  Result Date: 12/03/2022 CLINICAL DATA:  Right great toe  injury EXAM: RIGHT GREAT TOE COMPARISON:  None Available. FINDINGS: No acute fracture. No dislocation. 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 Remaining visualized joint spaces are preserved. Mild soft tissue thickening adjacent to the first MTP joint. 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 No acute fracture. Electronically Signed   By: Davina Poke D.O.   On: 12/03/2022 10:36    Procedures Procedures (including critical care time)  Medications Ordered in UC Medications - No data to display  Initial Impression / Assessment and Plan / UC Course  I have reviewed the triage vital signs and the nursing notes.  Pertinent labs & imaging results that were available during my care of the patient were reviewed by me and considered in my medical decision making (see chart for details).     Right great toe pain  Right great toe, no obvious fracture or dislocation. There is evidence of moderate to severe arthritis of the first MTP.  I have sent to his pharmacy some naproxen to help with his pain.  I also recommend hard soled shoe for the next couple days till his pain resolves.  He may continue with ice after work and warm soaks as tolerated.  Bilateral back pain This is likely muscle strain.  Discussed with him that he can use the naproxen I sent to his pharmacy for his back pain as well.  I have also sent methocarbamol to his pharmacy to help with some muscle spasms.  He was provided with some low back stretches and exercises to perform once his back begins to improve.  Work note provided for today.  Elevated blood pressure reading Patient has not previously been told he has elevated blood pressure.  He is very concerned about this today.  He is reportedly working on cutting back on his smoking currently.  He is aware that this may also be contributing to his elevated blood pressure.  We spent between 3 and 10 minutes discussing this. He is currently  asymptomatic.  I discussed with him smoking cessation, reducing sodium intake and regular exercise to help with lowering his blood pressure and a healthy lifestyle.  He has somebody at home that can help him check his blood pressure, recommend ambulatory monitoring.  If his blood pressure continues to be greater than 140/90 he should  return to the urgent care or seek mobile health clinic.  Final Clinical Impressions(s) / UC Diagnoses   Final diagnoses:  Acute bilateral low back pain without sciatica  Pain of toe of right foot  Elevated blood pressure reading  Tobacco user     Discharge Instructions      The x-rays of your toe did not show any fracture.  I have sent to your pharmacy naproxen 1 tablet twice a day for your pain and swelling.  This medicine will also be helpful for your back pain.  I have also sent some Robaxin, a muscle relaxer to your pharmacy that you can try in the evenings for muscle spasms.  Once your pain feels a little better in a couple days, I recommend you work on the stretches and exercises that I have provided for you.  Follow-up with your primary care provider if symptoms worsen or fail to improve. Your blood pressure today was elevated here.  The naproxen medication may increase your blood pressure a little bit so I would use it sparingly only as needed until your pain resolves.  Keep track of your blood pressure over the next couple weeks.  If it continues to be elevated greater than 140/90 recommend he follow-up with the urgent care or a primary care provider.  May also look at the [facility] health mobile health unit.  Smoking cessation, reduced salt diet and regular exercise can also be helpful.     ED Prescriptions     Medication Sig Dispense Auth. Provider   methocarbamol (ROBAXIN) 500 MG tablet Take 1 tablet (500 mg total) by mouth at bedtime as needed. 14 tablet Rue Valladares A, DO   naproxen (NAPROSYN) 500 MG tablet Take 1 tablet (500 mg total) by mouth 2  (two) times daily with a meal. 30 tablet Rodena Goldmann A, DO      PDMP not reviewed this encounter.   Elmore Guise, DO 12/03/22 1056

## 2022-12-03 NOTE — ED Triage Notes (Signed)
Pt reports lower back pain x 1 day and rt big toe pain x 1 day. States back pain just started last night. 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

## 2023-12-03 ENCOUNTER — Other Ambulatory Visit: Payer: Self-pay

## 2023-12-03 ENCOUNTER — Ambulatory Visit (HOSPITAL_COMMUNITY)
Admission: EM | Admit: 2023-12-03 | Discharge: 2023-12-03 | Disposition: A | Discharge status: Home / Self Care | Payer: Self-pay | Attending: Physician Assistant | Admitting: Physician Assistant

## 2023-12-03 ENCOUNTER — Encounter (HOSPITAL_COMMUNITY): Payer: Self-pay | Admitting: *Deleted

## 2023-12-03 ENCOUNTER — Ambulatory Visit (INDEPENDENT_AMBULATORY_CARE_PROVIDER_SITE_OTHER): Payer: Self-pay

## 2023-12-03 DIAGNOSIS — M7989 Other specified soft tissue disorders: Secondary | ICD-10-CM

## 2023-12-03 DIAGNOSIS — M79674 Pain in right toe(s): Secondary | ICD-10-CM

## 2023-12-03 DIAGNOSIS — M109 Gout, unspecified: Secondary | ICD-10-CM

## 2023-12-03 MED ORDER — COLCHICINE 0.6 MG PO TABS: 0.6000 mg | ORAL_TABLET | Freq: Every day | ORAL | 0 refills | Status: AC

## 2023-12-03 NOTE — Discharge Instructions (Addendum)
 At this time I do not see any obvious signs of fracture or dislocation to your fourth toe.  We are still waiting on radiology to provide the final imaging interpretation and we will keep you updated on this once it is available.   In regards to your great toe I suspect that you are likely suffering from gout.  I have sent in a prescription for a medication called colchicine for you to take as directed to help with the pain.  This will help reduce inflammation and thus help with pain management.  You can apply cool or warm compresses to the area per your preference.  I recommend keeping these on for about 15 minutes at a time with at least 30 minutes of a break. If your symptoms seem to be getting worse with increased swelling, redness, drainage from any of your toes, numbness or tingling please return here or go to the emergency room for further evaluation and management.

## 2023-12-03 NOTE — ED Triage Notes (Signed)
 PT reports last week he hit his RT 4th toe and has pain . Pt also has pain to RT great toe with out injury.

## 2023-12-03 NOTE — ED Provider Notes (Signed)
 [facility]    CSN: [id number] Arrival date & time: 12/03/23  4098      History   Chief Complaint Chief Complaint  Patient presents with   Toe Pain    HPI Alexander Clark is a 53 y.o. male.   HPI  He reports pain in two of the toes on right foot He reports hitting his fourth toe on a couch about a week ago and is not sure what is causing the pain in his great right toe  Interventions: Goody powder, he has been soaking it in warm water and Epsom salt for the past few days Reports relief while soaking it but pain returns soon after he is finished with soaking       History reviewed. No pertinent past medical history.  There are no active problems to display for this patient.   History reviewed. No pertinent surgical history.     Home Medications    Prior to Admission medications   Medication Sig Start Date End Date Taking? Authorizing Provider  colchicine 0.6 MG tablet Take 1 tablet (0.6 mg total) by mouth daily. 12/03/23  Yes Gerrod Maule E, PA-C  methocarbamol (ROBAXIN) 500 MG tablet Take 1 tablet (500 mg total) by mouth at bedtime as needed. 12/03/22   Gillermo Murdoch A, DO  naproxen (NAPROSYN) 500 MG tablet Take 1 tablet (500 mg total) by mouth 2 (two) times daily with a meal. 12/03/22   Claudie Leach, DO    Family History History reviewed. No pertinent family history.  Social History Social History   Tobacco Use   Smoking status: Every Day    Current packs/day: 0.50    Types: Cigarettes   Smokeless tobacco: Never  Vaping Use   Vaping status: Never Used  Substance Use Topics   Alcohol use: Yes    Comment: occ   Drug use: Yes    Types: Marijuana     Allergies   Patient has no known allergies.   Review of Systems Review of Systems  Musculoskeletal:        Patient reports right great toe pain as well as right fourth toe pain and swelling     Physical Exam Triage Vital Signs ED Triage Vitals  Encounter Vitals Group     BP  12/03/23 1100 (!) 142/90     Systolic BP Percentile --      Diastolic BP Percentile --      Pulse Rate 12/03/23 1100 74     Resp 12/03/23 1100 18     Temp 12/03/23 1100 97.9 F (36.6 C)     Temp src --      SpO2 12/03/23 1100 96 %     Weight --      Height --      Head Circumference --      Peak Flow --      Pain Score 12/03/23 1058 7     Pain Loc --      Pain Education --      Exclude from Growth Chart --    No data found.  Updated Vital Signs BP (!) 142/90   Pulse 74   Temp 97.9 F (36.6 C)   Resp 18   SpO2 96%   Visual Acuity Right Eye Distance:   Left Eye Distance:   Bilateral Distance:    Right Eye Near:   Left Eye Near:    Bilateral Near:     Physical Exam Vitals reviewed.  Constitutional:  Appearance: Normal appearance.  HENT:     Head: Normocephalic and atraumatic.  Cardiovascular:     Pulses:          Dorsalis pedis pulses are 2+ on the right side.       Posterior tibial pulses are 2+ on the right side.  Musculoskeletal:       Feet:  Feet:     Comments: There is mild swelling along the metatarsal of the great toe and  tenderness with gentle palpation Neurological:     General: No focal deficit present.     Mental Status: He is alert and oriented to person, place, and time.  Psychiatric:        Mood and Affect: Mood normal.        Behavior: Behavior normal.        Thought Content: Thought content normal.        Judgment: Judgment normal.      UC Treatments / Results  Labs (all labs ordered are listed, but only abnormal results are displayed) Labs Reviewed - No data to display  EKG   Radiology DG Foot Complete Right Result Date: 12/03/2023 CLINICAL DATA:  Swelling of the fourth toe and pain and great toe. No history of trauma EXAM: RIGHT FOOT COMPLETE - 3 VIEW COMPARISON:  None Available. 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 There is a  stone non displaced transverse fracture of the middle phalanx of the fourth digit. Preserved bone mineralization. Grossly preserved other joint spaces. IMPRESSION: Subtle fracture of the middle phalanx of the fourth digit. 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 Electronically Signed   By: Karen Kays M.D.   On: 12/03/2023 13:14    Procedures Procedures (including critical care time)  Medications Ordered in UC Medications - No data to display  Initial Impression / Assessment and Plan / UC Course  I have reviewed the triage vital signs and the nursing notes.  Pertinent labs & imaging results that were available during my care of the patient were reviewed by me and considered in my medical decision making (see chart for details).      Final Clinical Impressions(s) / UC Diagnoses   Final diagnoses:  Pain and swelling of toe of right foot  Acute gout involving toe of right foot, unspecified cause   Patient presents today with concerns for pain and swelling of the fourth toe on the right foot.  He reports that he struck the toe on the couch about 2 weeks ago and has had pain and swelling since then.  Radiographs of the foot show a subtle fracture of the middle phalanx of the fourth digit.  At this time recommend potential splinting to help with stability or buddy taping.  Patient was discharged prior to radiology reviewing imaging so we will contact patient with these results and recommendations. At this time I suspect likely gout is affecting the great right toe.  Symptoms appear consistent with gout with mildly elevated temperature, exquisite tenderness, mild swelling of the joint.  Will send in colchicine 0.6 mg to be taken once per day until symptoms resolve.  Will also provide patient education materials regarding low purine diet.  ED and return precautions were reviewed and provided in after visit summary.  Follow-up as needed.    Discharge  Instructions      At this time I do not see any obvious signs of fracture or dislocation  to your fourth toe.  We are still waiting on radiology to provide the final imaging interpretation and we will keep you updated on this once it is available.   In regards to your great toe I suspect that you are likely suffering from gout.  I have sent in a prescription for a medication called colchicine for you to take as directed to help with the pain.  This will help reduce inflammation and thus help with pain management.  You can apply cool or warm compresses to the area per your preference.  I recommend keeping these on for about 15 minutes at a time with at least 30 minutes of a break. If your symptoms seem to be getting worse with increased swelling, redness, drainage from any of your toes, numbness or tingling please return here or go to the emergency room for further evaluation and management.     ED Prescriptions     Medication Sig Dispense Auth. Provider   colchicine 0.6 MG tablet Take 1 tablet (0.6 mg total) by mouth daily. 15 tablet Xaiver Roskelley E, PA-C      PDMP not reviewed this encounter.   Providence Crosby, PA-C 12/03/23 2102

## 2023-12-04 NOTE — Progress Notes (Signed)
 Patient's x-ray shows a subtle fracture of the fourth toe on the right foot.  At this time he can try buddy taping it to one of the toes next to it or he can return to urgent care for evaluation and potential splint per his preference.  These typically do not require surgical intervention and usually heal routinely on their own.  If needed he can go to orthopedics for further evaluation and potential management as well.
